# Patient Record
Sex: Female | Born: 1950 | Hispanic: No | Marital: Married | Smoking: Former smoker
Health system: Southern US, Community
[De-identification: ages and names within clinical notes are randomized; demographics above are authoritative.]

## PROBLEM LIST (undated history)

## (undated) DIAGNOSIS — K589 Irritable bowel syndrome without diarrhea: Secondary | ICD-10-CM

## (undated) DIAGNOSIS — R590 Localized enlarged lymph nodes: Secondary | ICD-10-CM

## (undated) DIAGNOSIS — K219 Gastro-esophageal reflux disease without esophagitis: Secondary | ICD-10-CM

## (undated) DIAGNOSIS — C4490 Unspecified malignant neoplasm of skin, unspecified: Secondary | ICD-10-CM

## (undated) DIAGNOSIS — E785 Hyperlipidemia, unspecified: Secondary | ICD-10-CM

## (undated) HISTORY — DX: Gastro-esophageal reflux disease without esophagitis: K21.9

## (undated) HISTORY — DX: Irritable bowel syndrome without diarrhea: K58.9

## (undated) HISTORY — DX: Localized enlarged lymph nodes: R59.0

## (undated) HISTORY — DX: Hyperlipidemia, unspecified: E78.5

## (undated) HISTORY — DX: Unspecified malignant neoplasm of skin, unspecified: C44.90

---

## 1999-12-30 ENCOUNTER — Other Ambulatory Visit: Admission: RE | Admit: 1999-12-30 | Discharge: 1999-12-30 | Payer: Self-pay | Admitting: Obstetrics and Gynecology

## 2001-01-18 ENCOUNTER — Other Ambulatory Visit: Admission: RE | Admit: 2001-01-18 | Discharge: 2001-01-18 | Payer: Self-pay | Admitting: Obstetrics and Gynecology

## 2003-11-01 ENCOUNTER — Other Ambulatory Visit: Admission: RE | Admit: 2003-11-01 | Discharge: 2003-11-01 | Payer: Self-pay | Admitting: Obstetrics and Gynecology

## 2004-04-01 ENCOUNTER — Ambulatory Visit: Payer: Self-pay | Admitting: Cardiology

## 2005-02-11 ENCOUNTER — Other Ambulatory Visit: Admission: RE | Admit: 2005-02-11 | Discharge: 2005-02-11 | Payer: Self-pay | Admitting: Obstetrics and Gynecology

## 2013-01-17 ENCOUNTER — Encounter: Payer: Self-pay | Admitting: Internal Medicine

## 2013-01-17 ENCOUNTER — Ambulatory Visit (INDEPENDENT_AMBULATORY_CARE_PROVIDER_SITE_OTHER): Payer: BC Managed Care – PPO | Admitting: Internal Medicine

## 2013-01-17 VITALS — BP 132/70 | HR 75 | Temp 97.2°F | Ht 67.0 in | Wt 180.8 lb

## 2013-01-17 DIAGNOSIS — R911 Solitary pulmonary nodule: Secondary | ICD-10-CM

## 2013-01-17 NOTE — Patient Instructions (Addendum)
The nodule is probably benign but the only way to prove it is by following the radiology guidelines and repeat a limited CT of the nodule in 03/30/13   GERD (REFLUX)  is an extremely common cause of respiratory symptoms, many times with no significant heartburn at all.    It can be treated with medication, but also with lifestyle changes including avoidance of late meals, excessive alcohol, smoking cessation, and avoid fatty foods, chocolate, peppermint, colas, red wine, and acidic juices such as orange juice.  NO MINT OR MENTHOL PRODUCTS SO NO COUGH DROPS  USE SUGARLESS CANDY INSTEAD (jolley ranchers or Stover's)  NO OIL BASED VITAMINS - use powdered substitutes.

## 2013-01-17 NOTE — Progress Notes (Signed)
  Subjective:    Patient ID: Jody Wise, female    DOB: 1951-03-23   MRN: [id number]  HPI  62 y.o. wf quit smoking 05/26/73 with incidental spn during w/u for neck swelling ? Related to ear or sinus infection referred 01/17/2013  by Dr Shary Decamp for eval of nodule   01/17/2013 1st [facility] office visit/ Bren Borys cc neck pain June 2014 aburpt onset much better p started on prilosec July 18th with better sense pnds, no hb with no cough or sob  No obvious daytime variabilty or assoc chronic cough or cp or chest tightness, subjective wheeze overt sinus or hb symptoms. No unusual exp hx or h/o childhood pna/ asthma or knowledge of premature birth.   Sleeping ok without nocturnal  or early am exacerbation  of respiratory  c/o's or need for noct saba. Also denies any obvious fluctuation of symptoms with weather or environmental changes or other aggravating or alleviating factors except as outlined above   Current Medications, Allergies, Past Medical History, Past Surgical History, Family History, and Social History were reviewed in [employer] record.      Review of Systems  Constitutional: Negative for fever, chills and unexpected weight change.  HENT: Positive for ear pain and dental problem. Negative for nosebleeds, congestion, sore throat, rhinorrhea, sneezing, trouble swallowing, voice change, postnasal drip and sinus pressure.   Eyes: Negative for visual disturbance.  Respiratory: Negative for cough, choking and shortness of breath.   Cardiovascular: Negative for chest pain and leg swelling.  Gastrointestinal: Negative for vomiting, abdominal pain and diarrhea.  Genitourinary: Negative for difficulty urinating.       Heartburn Indigestion  Musculoskeletal: Negative for arthralgias.  Skin: Negative for rash.  Neurological: Negative for tremors, syncope and headaches.  Hematological: Does not bruise/bleed easily.       Objective:   Physical Exam  Wt Readings  from Last 3 Encounters:  01/17/13 180 lb 12.8 oz (82.01 kg)    HEENT: nl dentition, turbinates, and orophanx. Nl external ear canals without cough reflex   NECK :  without JVD/Nodes/TM/ nl carotid upstrokes bilaterally   LUNGS: no acc muscle use, clear to A and P bilaterally without cough on insp or exp maneuvers   CV:  RRR  no s3 or murmur or increase in P2, no edema   ABD:  soft and nontender with nl excursion in the supine position. No bruits or organomegaly, bowel sounds nl  MS:  warm without deformities, calf tenderness, cyanosis or clubbing  SKIN: warm and dry without lesions    NEURO:  alert, approp, no deficits    CT chest 12/28/12  7 mm gg nodule RUL      Assessment & Plan:

## 2013-01-18 DIAGNOSIS — R911 Solitary pulmonary nodule: Secondary | ICD-10-CM | POA: Insufficient documentation

## 2013-01-18 NOTE — Assessment & Plan Note (Signed)
-   incidental RUL 7 mm 12/28/12   Although only a remote smoker, there is still a chance this could be an early bronchogenic carcinoma so reasonable to follow the [employer] guidelines with f/u ct in 3 month - no other options at this point   Discussed in detail all the  indications, usual  risks and alternatives (surgery which is not indicated, biopsy which is not possible, pet which is too insensitive @ 7 mm)   relative to the benefits with patient who agrees to proceed with f/u ct in 3 months

## 2013-04-02 ENCOUNTER — Encounter: Payer: Self-pay | Admitting: Internal Medicine

## 2013-04-11 ENCOUNTER — Encounter: Payer: Self-pay | Admitting: Internal Medicine

## 2013-04-11 ENCOUNTER — Telehealth: Payer: Self-pay | Admitting: Internal Medicine

## 2013-04-11 NOTE — Telephone Encounter (Signed)
No change in nodule so rec recheck in one year  ( I placed in tickle for recall then)

## 2013-04-11 NOTE — Telephone Encounter (Signed)
I spoke with patient about results and she verbalized understanding and had no questions 

## 2013-04-11 NOTE — Telephone Encounter (Signed)
lmomtcb x1 for pt 

## 2013-04-11 NOTE — Telephone Encounter (Signed)
Pt returning call can be reached at [phone].Jody Wise

## 2013-04-11 NOTE — Telephone Encounter (Signed)
I spoke with pt. She reports she had CT done at [facility] 03/31/13. She asking if he can look at this and give her feedback. Please advise MW thanks  --this can be pulled up in PACS

## 2013-04-22 ENCOUNTER — Encounter: Payer: Self-pay | Admitting: Internal Medicine

## 2013-08-30 ENCOUNTER — Telehealth: Payer: Self-pay | Admitting: *Deleted

## 2013-08-30 DIAGNOSIS — R911 Solitary pulmonary nodule: Secondary | ICD-10-CM

## 2013-08-30 NOTE — Telephone Encounter (Signed)
Order sent to [facility] for ct chest at [facility]

## 2013-08-30 NOTE — Telephone Encounter (Signed)
Message copied by Rosana Berger on Tue Aug 30, 2013  5:03 PM ------      Message from: Tanda Rockers      Created: Sat Apr 02, 2013  7:00 AM       Needs limited ct R apex at [facility] no contrast this month ------

## 2014-04-10 ENCOUNTER — Other Ambulatory Visit: Payer: Self-pay | Admitting: Internal Medicine

## 2014-04-10 DIAGNOSIS — R911 Solitary pulmonary nodule: Secondary | ICD-10-CM

## 2015-09-25 DIAGNOSIS — K219 Gastro-esophageal reflux disease without esophagitis: Secondary | ICD-10-CM | POA: Insufficient documentation

## 2015-09-25 DIAGNOSIS — E559 Vitamin D deficiency, unspecified: Secondary | ICD-10-CM | POA: Insufficient documentation

## 2015-09-25 DIAGNOSIS — E782 Mixed hyperlipidemia: Secondary | ICD-10-CM | POA: Insufficient documentation

## 2015-09-25 DIAGNOSIS — E041 Nontoxic single thyroid nodule: Secondary | ICD-10-CM | POA: Insufficient documentation

## 2015-09-25 DIAGNOSIS — K582 Mixed irritable bowel syndrome: Secondary | ICD-10-CM | POA: Insufficient documentation

## 2015-09-25 DIAGNOSIS — Z79899 Other long term (current) drug therapy: Secondary | ICD-10-CM | POA: Insufficient documentation

## 2015-09-26 DIAGNOSIS — R5381 Other malaise: Secondary | ICD-10-CM | POA: Insufficient documentation

## 2015-09-26 DIAGNOSIS — R5383 Other fatigue: Secondary | ICD-10-CM | POA: Insufficient documentation

## 2016-07-17 DIAGNOSIS — M8588 Other specified disorders of bone density and structure, other site: Secondary | ICD-10-CM | POA: Diagnosis not present

## 2016-07-17 DIAGNOSIS — Z13820 Encounter for screening for osteoporosis: Secondary | ICD-10-CM | POA: Diagnosis not present

## 2016-07-17 DIAGNOSIS — Z01419 Encounter for gynecological examination (general) (routine) without abnormal findings: Secondary | ICD-10-CM | POA: Diagnosis not present

## 2016-07-17 DIAGNOSIS — Z6827 Body mass index (BMI) 27.0-27.9, adult: Secondary | ICD-10-CM | POA: Diagnosis not present

## 2016-07-17 DIAGNOSIS — N958 Other specified menopausal and perimenopausal disorders: Secondary | ICD-10-CM | POA: Diagnosis not present

## 2016-07-17 DIAGNOSIS — Z1272 Encounter for screening for malignant neoplasm of vagina: Secondary | ICD-10-CM | POA: Diagnosis not present

## 2016-07-17 DIAGNOSIS — K589 Irritable bowel syndrome without diarrhea: Secondary | ICD-10-CM | POA: Diagnosis not present

## 2016-07-17 DIAGNOSIS — Z90712 Acquired absence of cervix with remaining uterus: Secondary | ICD-10-CM | POA: Diagnosis not present

## 2016-07-17 DIAGNOSIS — Z136 Encounter for screening for cardiovascular disorders: Secondary | ICD-10-CM | POA: Diagnosis not present

## 2016-07-17 DIAGNOSIS — R5383 Other fatigue: Secondary | ICD-10-CM | POA: Diagnosis not present

## 2016-07-17 DIAGNOSIS — K588 Other irritable bowel syndrome: Secondary | ICD-10-CM | POA: Diagnosis not present

## 2016-07-17 DIAGNOSIS — Z124 Encounter for screening for malignant neoplasm of cervix: Secondary | ICD-10-CM | POA: Diagnosis not present

## 2016-07-28 DIAGNOSIS — R197 Diarrhea, unspecified: Secondary | ICD-10-CM | POA: Diagnosis not present

## 2016-07-28 DIAGNOSIS — R140 Abdominal distension (gaseous): Secondary | ICD-10-CM | POA: Diagnosis not present

## 2016-07-28 DIAGNOSIS — E785 Hyperlipidemia, unspecified: Secondary | ICD-10-CM | POA: Diagnosis not present

## 2016-09-23 DIAGNOSIS — Z79899 Other long term (current) drug therapy: Secondary | ICD-10-CM | POA: Diagnosis not present

## 2016-09-23 DIAGNOSIS — E782 Mixed hyperlipidemia: Secondary | ICD-10-CM | POA: Diagnosis not present

## 2016-09-23 DIAGNOSIS — E559 Vitamin D deficiency, unspecified: Secondary | ICD-10-CM | POA: Diagnosis not present

## 2016-09-23 DIAGNOSIS — R5383 Other fatigue: Secondary | ICD-10-CM | POA: Diagnosis not present

## 2016-09-23 DIAGNOSIS — K582 Mixed irritable bowel syndrome: Secondary | ICD-10-CM | POA: Diagnosis not present

## 2016-09-23 DIAGNOSIS — K219 Gastro-esophageal reflux disease without esophagitis: Secondary | ICD-10-CM | POA: Diagnosis not present

## 2016-09-23 DIAGNOSIS — R5381 Other malaise: Secondary | ICD-10-CM | POA: Diagnosis not present

## 2016-09-23 DIAGNOSIS — E041 Nontoxic single thyroid nodule: Secondary | ICD-10-CM | POA: Diagnosis not present

## 2017-01-06 ENCOUNTER — Telehealth: Payer: Self-pay | Admitting: Internal Medicine

## 2017-01-06 NOTE — Telephone Encounter (Signed)
Called and spoke to pt. Pt is requesting a f/u CT for lung nodule. Pt last seen in 2014. Appt made with MW in 01/2017. Pt verbalized understanding and denied any further questions or concerns at this time.

## 2017-01-21 DIAGNOSIS — H906 Mixed conductive and sensorineural hearing loss, bilateral: Secondary | ICD-10-CM | POA: Diagnosis not present

## 2017-01-21 DIAGNOSIS — H9202 Otalgia, left ear: Secondary | ICD-10-CM | POA: Diagnosis not present

## 2017-01-21 DIAGNOSIS — H6982 Other specified disorders of Eustachian tube, left ear: Secondary | ICD-10-CM | POA: Diagnosis not present

## 2017-01-21 DIAGNOSIS — M26622 Arthralgia of left temporomandibular joint: Secondary | ICD-10-CM | POA: Diagnosis not present

## 2017-01-21 DIAGNOSIS — H9312 Tinnitus, left ear: Secondary | ICD-10-CM | POA: Diagnosis not present

## 2017-01-21 DIAGNOSIS — H7412 Adhesive left middle ear disease: Secondary | ICD-10-CM | POA: Diagnosis not present

## 2017-01-28 DIAGNOSIS — H906 Mixed conductive and sensorineural hearing loss, bilateral: Secondary | ICD-10-CM | POA: Diagnosis not present

## 2017-01-28 DIAGNOSIS — H6982 Other specified disorders of Eustachian tube, left ear: Secondary | ICD-10-CM | POA: Diagnosis not present

## 2017-01-28 DIAGNOSIS — H7412 Adhesive left middle ear disease: Secondary | ICD-10-CM | POA: Diagnosis not present

## 2017-01-28 DIAGNOSIS — M26622 Arthralgia of left temporomandibular joint: Secondary | ICD-10-CM | POA: Diagnosis not present

## 2017-01-28 DIAGNOSIS — H9202 Otalgia, left ear: Secondary | ICD-10-CM | POA: Diagnosis not present

## 2017-01-28 DIAGNOSIS — H9312 Tinnitus, left ear: Secondary | ICD-10-CM | POA: Diagnosis not present

## 2017-02-13 ENCOUNTER — Ambulatory Visit (INDEPENDENT_AMBULATORY_CARE_PROVIDER_SITE_OTHER): Payer: PPO | Admitting: Internal Medicine

## 2017-02-13 ENCOUNTER — Encounter: Payer: Self-pay | Admitting: Internal Medicine

## 2017-02-13 VITALS — BP 106/60 | HR 65 | Ht 67.0 in | Wt 176.6 lb

## 2017-02-13 DIAGNOSIS — R911 Solitary pulmonary nodule: Secondary | ICD-10-CM | POA: Diagnosis not present

## 2017-02-13 NOTE — Patient Instructions (Signed)
Please see patient coordinator before you leave today  to schedule CT chest no contrast needed   Pulmonary follow up is as needed

## 2017-02-13 NOTE — Progress Notes (Signed)
Subjective:    Patient ID: Jody Wise, female    DOB: 1950-12-21   MRN: [id number]    Brief patient profile:  66 y.o. wf quit smoking 05/26/73 with incidental spn during w/u for neck swelling ? Related to ear or sinus infection referred 01/17/2013  by Dr Bea Graff for eval of nodule.    History of Present Illness  01/17/2013 1st [place] [facility] office visit/ Jaleya Pebley cc neck pain June 2014 abrupt onset much better p started on prilosec July 18th with better sense pnds, no hb with no cough or sob rec The nodule is probably benign but the only way to prove it is by following the radiology guidelines and repeat a limited CT of the nodule in 03/30/13> no change > rec repeat in one year but declined due to cost  GERD diet    02/13/2017  f/u ov/Kolbie Lepkowski re:  Re-establish re SPN  > 3 years since last contact  Chief Complaint  Patient presents with  . Pulmonary Consult    Self referral to f/u on pulmonary nodule. She denies any respiratory co's today.    Almost all her upper resp symptoms resolved p oral surgery at [facility] - does have TMJ, uses aleve, no other rx other than prilosec prn and dicyclomine for ibs   Not limited by breathing from desired activities/ occ HB but only takes ppi prn     No obvious day to day or daytime variability or assoc excess/ purulent sputum or mucus plugs or hemoptysis or cp or chest tightness, subjective wheeze or overt sinus  symptoms. No unusual exp hx or h/o childhood pna/ asthma or knowledge of premature birth.  Sleeping ok flat without nocturnal  or early am exacerbation  of respiratory  c/o's or need for noct saba. Also denies any obvious fluctuation of symptoms with weather or environmental changes or other aggravating or alleviating factors except as outlined above   Current Allergies, Complete Past Medical History, Past Surgical History, Family History, and Social History were reviewed in [employer] record.  ROS  The following are not  active complaints unless bolded sore throat, dysphagia, dental problems, itching, sneezing,  nasal congestion or disharge of excess mucus or purulent secretions, ear ache,   fever, chills, sweats, unintended wt loss or wt gain, classically pleuritic or exertional cp,  orthopnea pnd or leg swelling, presyncope, palpitations, abdominal pain controlled on ibs rx, anorexia, nausea, vomiting, diarrhea  or change in bowel habits or bladder habits, change in stools or change in urine, dysuria, hematuria,  rash, arthralgias, visual complaints, headache, numbness, weakness or ataxia or problems with walking or coordination,  change in mood/affect or memory.        Current Meds  Medication Sig  . dicyclomine (BENTYL) 10 MG capsule Take 10 mg by mouth 2 (two) times daily.  Marland Kitchen omeprazole (PRILOSEC) 20 MG capsule Take 20 mg by mouth 2 (two) times daily as needed.             Objective:   Physical Exam  Wt Readings from Last 3 Encounters:  02/13/17 176 lb 9.6 oz (80.1 kg)  01/17/13 180 lb 12.8 oz (82 kg)    Vital signs reviewed  - Note on arrival 02 sats  99% on RA      HEENT: nl dentition, turbinates, and orophanx. Nl external ear canals without cough reflex   NECK :  without JVD/Nodes/TM/ nl carotid upstrokes bilaterally   LUNGS: no acc muscle use, clear to A  and P bilaterally without cough on insp or exp maneuvers   CV:  RRR  no s3 or murmur or increase in P2, no edema   ABD:  soft and nontender with nl excursion in the supine position. No bruits or organomegaly, bowel sounds nl  MS:  warm without deformities, calf tenderness, cyanosis or clubbing  SKIN: warm and dry without lesions    NEURO:  alert, approp, no deficits         Assessment & Plan:

## 2017-02-13 NOTE — Assessment & Plan Note (Signed)
-   incidental RUL 7 mm 12/28/12 > repeat 03/29/13 no change, rec repeat 6 months limited (former smoker so higher risk) >  9 x7 mm GG changes s growth > rec recheck 12 months > declined due to insurance - Repeat CT chest 02/13/2017  CT results reviewed with pt >>> Prev nodule Too small for PET or bx, not suspicious enough for excisional bx > really only option for now is follow the [employer]ischner society guidelines as rec by radiology > overdue for f/u > will have it done here so prev studies can be loaded onto canopy for use moving forward if needed  but hopefully this will  Be the final study.  Discussed in detail all the  indications, usual  risks and alternatives  relative to the benefits with patient who agrees to proceed with conservative f/u as outlined    I had an extended discussion with the patient reviewing all relevant studies completed to date and  lasting 25 minutes of a 45  minute office visit to re-establish serial f/u of non-specific nodule

## 2017-03-02 ENCOUNTER — Ambulatory Visit (INDEPENDENT_AMBULATORY_CARE_PROVIDER_SITE_OTHER)
Admission: RE | Admit: 2017-03-02 | Discharge: 2017-03-02 | Disposition: A | Discharge status: Home / Self Care | Payer: PPO | Source: Ambulatory Visit | Attending: Internal Medicine | Admitting: Internal Medicine

## 2017-03-02 DIAGNOSIS — R911 Solitary pulmonary nodule: Secondary | ICD-10-CM | POA: Diagnosis not present

## 2017-03-03 NOTE — Progress Notes (Signed)
Spoke with pt and notified of results per Dr. Wert. Pt verbalized understanding and denied any questions. 

## 2017-09-02 DIAGNOSIS — E782 Mixed hyperlipidemia: Secondary | ICD-10-CM | POA: Diagnosis not present

## 2017-09-02 DIAGNOSIS — E041 Nontoxic single thyroid nodule: Secondary | ICD-10-CM | POA: Diagnosis not present

## 2017-09-02 DIAGNOSIS — K219 Gastro-esophageal reflux disease without esophagitis: Secondary | ICD-10-CM | POA: Diagnosis not present

## 2017-09-02 DIAGNOSIS — E559 Vitamin D deficiency, unspecified: Secondary | ICD-10-CM | POA: Diagnosis not present

## 2017-09-02 DIAGNOSIS — R5381 Other malaise: Secondary | ICD-10-CM | POA: Diagnosis not present

## 2017-09-02 DIAGNOSIS — Z0000 Encounter for general adult medical examination without abnormal findings: Secondary | ICD-10-CM | POA: Diagnosis not present

## 2017-09-02 DIAGNOSIS — R1012 Left upper quadrant pain: Secondary | ICD-10-CM | POA: Diagnosis not present

## 2017-09-02 DIAGNOSIS — R5383 Other fatigue: Secondary | ICD-10-CM | POA: Diagnosis not present

## 2017-09-02 DIAGNOSIS — R911 Solitary pulmonary nodule: Secondary | ICD-10-CM | POA: Diagnosis not present

## 2017-09-02 DIAGNOSIS — Z79899 Other long term (current) drug therapy: Secondary | ICD-10-CM | POA: Diagnosis not present

## 2017-09-02 DIAGNOSIS — K582 Mixed irritable bowel syndrome: Secondary | ICD-10-CM | POA: Diagnosis not present

## 2018-01-06 DIAGNOSIS — E7800 Pure hypercholesterolemia, unspecified: Secondary | ICD-10-CM | POA: Diagnosis not present

## 2018-01-06 DIAGNOSIS — Z6827 Body mass index (BMI) 27.0-27.9, adult: Secondary | ICD-10-CM | POA: Diagnosis not present

## 2018-01-06 DIAGNOSIS — Z01419 Encounter for gynecological examination (general) (routine) without abnormal findings: Secondary | ICD-10-CM | POA: Diagnosis not present

## 2018-01-26 ENCOUNTER — Other Ambulatory Visit: Payer: Self-pay | Admitting: Internal Medicine

## 2018-01-26 DIAGNOSIS — R911 Solitary pulmonary nodule: Secondary | ICD-10-CM

## 2018-01-26 DIAGNOSIS — R918 Other nonspecific abnormal finding of lung field: Secondary | ICD-10-CM

## 2018-03-03 ENCOUNTER — Ambulatory Visit (INDEPENDENT_AMBULATORY_CARE_PROVIDER_SITE_OTHER)
Admission: RE | Admit: 2018-03-03 | Discharge: 2018-03-03 | Disposition: A | Discharge status: Home / Self Care | Payer: PPO | Source: Ambulatory Visit | Attending: Internal Medicine | Admitting: Internal Medicine

## 2018-03-03 DIAGNOSIS — R911 Solitary pulmonary nodule: Secondary | ICD-10-CM

## 2018-03-03 DIAGNOSIS — R918 Other nonspecific abnormal finding of lung field: Secondary | ICD-10-CM

## 2018-03-03 NOTE — Progress Notes (Signed)
Spoke with pt and notified of results per Dr. Wert. Pt verbalized understanding and denied any questions. 

## 2018-06-04 DIAGNOSIS — L821 Other seborrheic keratosis: Secondary | ICD-10-CM | POA: Diagnosis not present

## 2018-06-04 DIAGNOSIS — D485 Neoplasm of uncertain behavior of skin: Secondary | ICD-10-CM | POA: Diagnosis not present

## 2018-06-04 DIAGNOSIS — D224 Melanocytic nevi of scalp and neck: Secondary | ICD-10-CM | POA: Diagnosis not present

## 2018-07-14 DIAGNOSIS — K219 Gastro-esophageal reflux disease without esophagitis: Secondary | ICD-10-CM | POA: Diagnosis not present

## 2018-07-14 DIAGNOSIS — R7303 Prediabetes: Secondary | ICD-10-CM | POA: Insufficient documentation

## 2018-07-14 DIAGNOSIS — F419 Anxiety disorder, unspecified: Secondary | ICD-10-CM | POA: Insufficient documentation

## 2018-07-14 DIAGNOSIS — M8949 Other hypertrophic osteoarthropathy, multiple sites: Secondary | ICD-10-CM | POA: Insufficient documentation

## 2018-07-14 DIAGNOSIS — M159 Polyosteoarthritis, unspecified: Secondary | ICD-10-CM | POA: Insufficient documentation

## 2018-07-14 DIAGNOSIS — M150 Primary generalized (osteo)arthritis: Secondary | ICD-10-CM | POA: Diagnosis not present

## 2018-07-14 DIAGNOSIS — R911 Solitary pulmonary nodule: Secondary | ICD-10-CM | POA: Diagnosis not present

## 2018-07-14 DIAGNOSIS — E782 Mixed hyperlipidemia: Secondary | ICD-10-CM | POA: Diagnosis not present

## 2018-07-14 DIAGNOSIS — E041 Nontoxic single thyroid nodule: Secondary | ICD-10-CM | POA: Diagnosis not present

## 2018-07-14 DIAGNOSIS — Z79899 Other long term (current) drug therapy: Secondary | ICD-10-CM | POA: Diagnosis not present

## 2018-07-14 DIAGNOSIS — E559 Vitamin D deficiency, unspecified: Secondary | ICD-10-CM | POA: Diagnosis not present

## 2018-07-14 DIAGNOSIS — K582 Mixed irritable bowel syndrome: Secondary | ICD-10-CM | POA: Diagnosis not present

## 2018-07-14 DIAGNOSIS — R5381 Other malaise: Secondary | ICD-10-CM | POA: Diagnosis not present

## 2018-07-14 DIAGNOSIS — R5383 Other fatigue: Secondary | ICD-10-CM | POA: Diagnosis not present

## 2018-07-27 DIAGNOSIS — M47816 Spondylosis without myelopathy or radiculopathy, lumbar region: Secondary | ICD-10-CM | POA: Diagnosis not present

## 2018-07-27 DIAGNOSIS — M47814 Spondylosis without myelopathy or radiculopathy, thoracic region: Secondary | ICD-10-CM | POA: Diagnosis not present

## 2018-07-27 DIAGNOSIS — M4184 Other forms of scoliosis, thoracic region: Secondary | ICD-10-CM | POA: Diagnosis not present

## 2018-07-27 DIAGNOSIS — M160 Bilateral primary osteoarthritis of hip: Secondary | ICD-10-CM | POA: Diagnosis not present

## 2018-07-28 DIAGNOSIS — M150 Primary generalized (osteo)arthritis: Secondary | ICD-10-CM | POA: Diagnosis not present

## 2018-10-13 DIAGNOSIS — H52223 Regular astigmatism, bilateral: Secondary | ICD-10-CM | POA: Diagnosis not present

## 2018-11-17 DIAGNOSIS — D225 Melanocytic nevi of trunk: Secondary | ICD-10-CM | POA: Diagnosis not present

## 2018-11-17 DIAGNOSIS — L918 Other hypertrophic disorders of the skin: Secondary | ICD-10-CM | POA: Diagnosis not present

## 2018-11-17 DIAGNOSIS — L821 Other seborrheic keratosis: Secondary | ICD-10-CM | POA: Diagnosis not present

## 2018-11-17 DIAGNOSIS — Z85828 Personal history of other malignant neoplasm of skin: Secondary | ICD-10-CM | POA: Diagnosis not present

## 2018-11-17 DIAGNOSIS — D2261 Melanocytic nevi of right upper limb, including shoulder: Secondary | ICD-10-CM | POA: Diagnosis not present

## 2018-11-17 DIAGNOSIS — D2272 Melanocytic nevi of left lower limb, including hip: Secondary | ICD-10-CM | POA: Diagnosis not present

## 2018-11-17 DIAGNOSIS — D1801 Hemangioma of skin and subcutaneous tissue: Secondary | ICD-10-CM | POA: Diagnosis not present

## 2018-11-17 DIAGNOSIS — D2262 Melanocytic nevi of left upper limb, including shoulder: Secondary | ICD-10-CM | POA: Diagnosis not present

## 2018-11-17 DIAGNOSIS — D2271 Melanocytic nevi of right lower limb, including hip: Secondary | ICD-10-CM | POA: Diagnosis not present

## 2019-01-25 DIAGNOSIS — R5381 Other malaise: Secondary | ICD-10-CM | POA: Diagnosis not present

## 2019-01-25 DIAGNOSIS — E041 Nontoxic single thyroid nodule: Secondary | ICD-10-CM | POA: Diagnosis not present

## 2019-01-25 DIAGNOSIS — E559 Vitamin D deficiency, unspecified: Secondary | ICD-10-CM | POA: Diagnosis not present

## 2019-01-25 DIAGNOSIS — Z79899 Other long term (current) drug therapy: Secondary | ICD-10-CM | POA: Diagnosis not present

## 2019-01-25 DIAGNOSIS — M8949 Other hypertrophic osteoarthropathy, multiple sites: Secondary | ICD-10-CM | POA: Diagnosis not present

## 2019-01-25 DIAGNOSIS — E782 Mixed hyperlipidemia: Secondary | ICD-10-CM | POA: Diagnosis not present

## 2019-01-25 DIAGNOSIS — F5101 Primary insomnia: Secondary | ICD-10-CM | POA: Insufficient documentation

## 2019-01-25 DIAGNOSIS — G629 Polyneuropathy, unspecified: Secondary | ICD-10-CM | POA: Diagnosis not present

## 2019-01-25 DIAGNOSIS — M79604 Pain in right leg: Secondary | ICD-10-CM | POA: Diagnosis not present

## 2019-01-25 DIAGNOSIS — K582 Mixed irritable bowel syndrome: Secondary | ICD-10-CM | POA: Diagnosis not present

## 2019-01-25 DIAGNOSIS — R7303 Prediabetes: Secondary | ICD-10-CM | POA: Diagnosis not present

## 2019-01-25 DIAGNOSIS — K219 Gastro-esophageal reflux disease without esophagitis: Secondary | ICD-10-CM | POA: Diagnosis not present

## 2019-01-25 DIAGNOSIS — F419 Anxiety disorder, unspecified: Secondary | ICD-10-CM | POA: Diagnosis not present

## 2019-01-26 DIAGNOSIS — M50322 Other cervical disc degeneration at C5-C6 level: Secondary | ICD-10-CM | POA: Diagnosis not present

## 2019-01-26 DIAGNOSIS — M50321 Other cervical disc degeneration at C4-C5 level: Secondary | ICD-10-CM | POA: Diagnosis not present

## 2019-01-26 DIAGNOSIS — M542 Cervicalgia: Secondary | ICD-10-CM | POA: Diagnosis not present

## 2019-01-26 DIAGNOSIS — R5381 Other malaise: Secondary | ICD-10-CM | POA: Diagnosis not present

## 2019-01-26 DIAGNOSIS — E559 Vitamin D deficiency, unspecified: Secondary | ICD-10-CM | POA: Diagnosis not present

## 2019-01-26 DIAGNOSIS — G629 Polyneuropathy, unspecified: Secondary | ICD-10-CM | POA: Diagnosis not present

## 2019-01-26 DIAGNOSIS — R5383 Other fatigue: Secondary | ICD-10-CM | POA: Diagnosis not present

## 2019-01-26 DIAGNOSIS — R7989 Other specified abnormal findings of blood chemistry: Secondary | ICD-10-CM | POA: Diagnosis not present

## 2019-01-26 DIAGNOSIS — E782 Mixed hyperlipidemia: Secondary | ICD-10-CM | POA: Diagnosis not present

## 2019-01-26 DIAGNOSIS — M47812 Spondylosis without myelopathy or radiculopathy, cervical region: Secondary | ICD-10-CM | POA: Diagnosis not present

## 2019-02-16 DIAGNOSIS — H903 Sensorineural hearing loss, bilateral: Secondary | ICD-10-CM | POA: Diagnosis not present

## 2019-03-07 DIAGNOSIS — M8949 Other hypertrophic osteoarthropathy, multiple sites: Secondary | ICD-10-CM | POA: Diagnosis not present

## 2019-03-07 DIAGNOSIS — K582 Mixed irritable bowel syndrome: Secondary | ICD-10-CM | POA: Diagnosis not present

## 2019-03-07 DIAGNOSIS — Z8719 Personal history of other diseases of the digestive system: Secondary | ICD-10-CM | POA: Diagnosis not present

## 2019-03-08 DIAGNOSIS — K51911 Ulcerative colitis, unspecified with rectal bleeding: Secondary | ICD-10-CM | POA: Diagnosis not present

## 2019-03-14 DIAGNOSIS — K51911 Ulcerative colitis, unspecified with rectal bleeding: Secondary | ICD-10-CM | POA: Diagnosis not present

## 2019-03-14 DIAGNOSIS — K529 Noninfective gastroenteritis and colitis, unspecified: Secondary | ICD-10-CM | POA: Diagnosis not present

## 2019-03-14 DIAGNOSIS — K6389 Other specified diseases of intestine: Secondary | ICD-10-CM | POA: Diagnosis not present

## 2019-04-13 DIAGNOSIS — H90A32 Mixed conductive and sensorineural hearing loss, unilateral, left ear with restricted hearing on the contralateral side: Secondary | ICD-10-CM | POA: Insufficient documentation

## 2019-04-13 DIAGNOSIS — H90A21 Sensorineural hearing loss, unilateral, right ear, with restricted hearing on the contralateral side: Secondary | ICD-10-CM | POA: Insufficient documentation

## 2019-04-14 ENCOUNTER — Telehealth: Payer: Self-pay | Admitting: Gastroenterology

## 2019-04-15 DIAGNOSIS — G4489 Other headache syndrome: Secondary | ICD-10-CM | POA: Diagnosis not present

## 2019-04-15 DIAGNOSIS — M950 Acquired deformity of nose: Secondary | ICD-10-CM | POA: Insufficient documentation

## 2019-04-15 DIAGNOSIS — Z974 Presence of external hearing-aid: Secondary | ICD-10-CM | POA: Diagnosis not present

## 2019-04-15 DIAGNOSIS — Z87891 Personal history of nicotine dependence: Secondary | ICD-10-CM | POA: Diagnosis not present

## 2019-04-15 DIAGNOSIS — H90A32 Mixed conductive and sensorineural hearing loss, unilateral, left ear with restricted hearing on the contralateral side: Secondary | ICD-10-CM | POA: Diagnosis not present

## 2019-04-15 DIAGNOSIS — H90A21 Sensorineural hearing loss, unilateral, right ear, with restricted hearing on the contralateral side: Secondary | ICD-10-CM | POA: Diagnosis not present

## 2019-04-29 NOTE — Telephone Encounter (Signed)
Please update status of records review.  °

## 2019-04-29 NOTE — Telephone Encounter (Signed)
Have not seen these records or personally placed them on Dr. Lynne Leader desk. Re-request them and hand them to me to place them on his desk.

## 2019-05-02 NOTE — Telephone Encounter (Signed)
Pt only dropped off last colon report with no additional records.  Spoke with patient and she is suppose to have all GI records sent for review.

## 2019-05-02 NOTE — Telephone Encounter (Signed)
Additional office notes received from Dr. Rolm Bookbinder and colon report will be sent to Dr. Fuller Plan for review.

## 2019-05-04 DIAGNOSIS — G4489 Other headache syndrome: Secondary | ICD-10-CM | POA: Diagnosis not present

## 2019-05-06 NOTE — Telephone Encounter (Signed)
Dr. Fuller Plan reviewed records and declined to accept patient at this time.  Pt was notified and records were shredded

## 2019-06-28 DIAGNOSIS — M461 Sacroiliitis, not elsewhere classified: Secondary | ICD-10-CM | POA: Diagnosis not present

## 2019-06-28 DIAGNOSIS — K5130 Ulcerative (chronic) rectosigmoiditis without complications: Secondary | ICD-10-CM | POA: Diagnosis not present

## 2019-07-28 DIAGNOSIS — K5130 Ulcerative (chronic) rectosigmoiditis without complications: Secondary | ICD-10-CM | POA: Diagnosis not present

## 2019-08-04 DIAGNOSIS — K5130 Ulcerative (chronic) rectosigmoiditis without complications: Secondary | ICD-10-CM | POA: Diagnosis not present

## 2019-09-27 DIAGNOSIS — K219 Gastro-esophageal reflux disease without esophagitis: Secondary | ICD-10-CM | POA: Diagnosis not present

## 2019-09-27 DIAGNOSIS — E782 Mixed hyperlipidemia: Secondary | ICD-10-CM | POA: Diagnosis not present

## 2019-09-27 DIAGNOSIS — K5190 Ulcerative colitis, unspecified, without complications: Secondary | ICD-10-CM | POA: Insufficient documentation

## 2019-09-27 DIAGNOSIS — Z79899 Other long term (current) drug therapy: Secondary | ICD-10-CM | POA: Diagnosis not present

## 2019-09-27 DIAGNOSIS — R5381 Other malaise: Secondary | ICD-10-CM | POA: Diagnosis not present

## 2019-09-27 DIAGNOSIS — H90A21 Sensorineural hearing loss, unilateral, right ear, with restricted hearing on the contralateral side: Secondary | ICD-10-CM | POA: Diagnosis not present

## 2019-09-27 DIAGNOSIS — R911 Solitary pulmonary nodule: Secondary | ICD-10-CM | POA: Diagnosis not present

## 2019-09-27 DIAGNOSIS — E041 Nontoxic single thyroid nodule: Secondary | ICD-10-CM | POA: Diagnosis not present

## 2019-09-27 DIAGNOSIS — H90A32 Mixed conductive and sensorineural hearing loss, unilateral, left ear with restricted hearing on the contralateral side: Secondary | ICD-10-CM | POA: Diagnosis not present

## 2019-09-27 DIAGNOSIS — M8949 Other hypertrophic osteoarthropathy, multiple sites: Secondary | ICD-10-CM | POA: Diagnosis not present

## 2019-09-27 DIAGNOSIS — K582 Mixed irritable bowel syndrome: Secondary | ICD-10-CM | POA: Diagnosis not present

## 2019-09-27 DIAGNOSIS — R7303 Prediabetes: Secondary | ICD-10-CM | POA: Diagnosis not present

## 2019-09-28 DIAGNOSIS — E782 Mixed hyperlipidemia: Secondary | ICD-10-CM | POA: Diagnosis not present

## 2019-09-28 DIAGNOSIS — R5383 Other fatigue: Secondary | ICD-10-CM | POA: Diagnosis not present

## 2019-09-28 DIAGNOSIS — K5190 Ulcerative colitis, unspecified, without complications: Secondary | ICD-10-CM | POA: Diagnosis not present

## 2019-09-28 DIAGNOSIS — R7303 Prediabetes: Secondary | ICD-10-CM | POA: Diagnosis not present

## 2019-09-28 DIAGNOSIS — R5381 Other malaise: Secondary | ICD-10-CM | POA: Diagnosis not present

## 2019-10-06 DIAGNOSIS — K5130 Ulcerative (chronic) rectosigmoiditis without complications: Secondary | ICD-10-CM | POA: Diagnosis not present

## 2019-10-13 DIAGNOSIS — K5130 Ulcerative (chronic) rectosigmoiditis without complications: Secondary | ICD-10-CM | POA: Diagnosis not present

## 2019-11-17 DIAGNOSIS — K5130 Ulcerative (chronic) rectosigmoiditis without complications: Secondary | ICD-10-CM | POA: Diagnosis not present

## 2020-01-06 DIAGNOSIS — B351 Tinea unguium: Secondary | ICD-10-CM | POA: Diagnosis not present

## 2020-01-06 DIAGNOSIS — D2261 Melanocytic nevi of right upper limb, including shoulder: Secondary | ICD-10-CM | POA: Diagnosis not present

## 2020-01-06 DIAGNOSIS — D1801 Hemangioma of skin and subcutaneous tissue: Secondary | ICD-10-CM | POA: Diagnosis not present

## 2020-01-06 DIAGNOSIS — D2262 Melanocytic nevi of left upper limb, including shoulder: Secondary | ICD-10-CM | POA: Diagnosis not present

## 2020-01-06 DIAGNOSIS — L304 Erythema intertrigo: Secondary | ICD-10-CM | POA: Diagnosis not present

## 2020-01-06 DIAGNOSIS — L820 Inflamed seborrheic keratosis: Secondary | ICD-10-CM | POA: Diagnosis not present

## 2020-01-06 DIAGNOSIS — L821 Other seborrheic keratosis: Secondary | ICD-10-CM | POA: Diagnosis not present

## 2020-01-06 DIAGNOSIS — D2272 Melanocytic nevi of left lower limb, including hip: Secondary | ICD-10-CM | POA: Diagnosis not present

## 2020-01-06 DIAGNOSIS — D225 Melanocytic nevi of trunk: Secondary | ICD-10-CM | POA: Diagnosis not present

## 2020-02-10 IMAGING — CT CT CHEST W/O CM
2 of 3 series · 15 of 36 positions shown, 18 images · non-contrast
Comparison: 03/02/2017

CLINICAL DATA: Pulmonary nodule.  1 year follow-up.

EXAM:
CT CHEST WITHOUT CONTRAST
TECHNIQUE: Multidetector CT imaging of the chest was performed following the
standard protocol without IV contrast.

[Series 2: thorax · axial · 0.73mm/px · z∈[-306,-48]mm · 12 of 153 slices shown, 15 images]
[im 12/153  mediastinal]
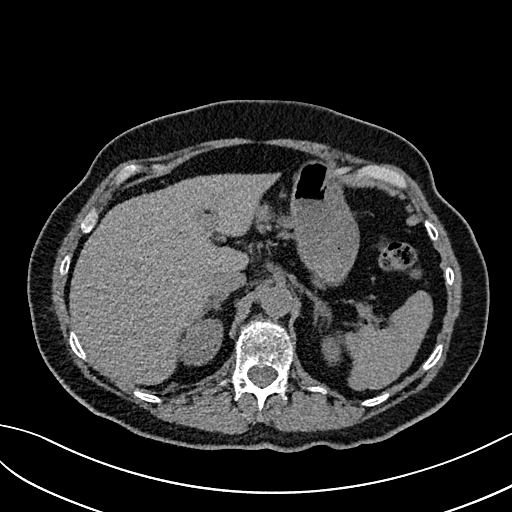
[im 12/153  lung]
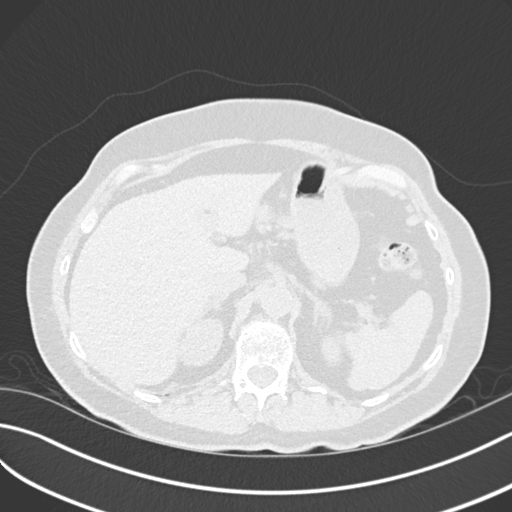
[im 23/153  lung]
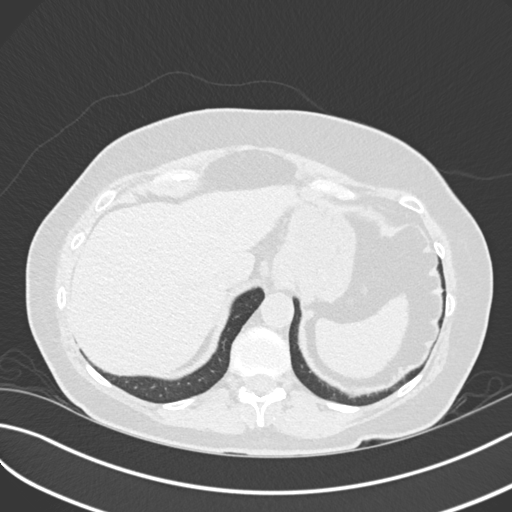
[im 34/153  lung]
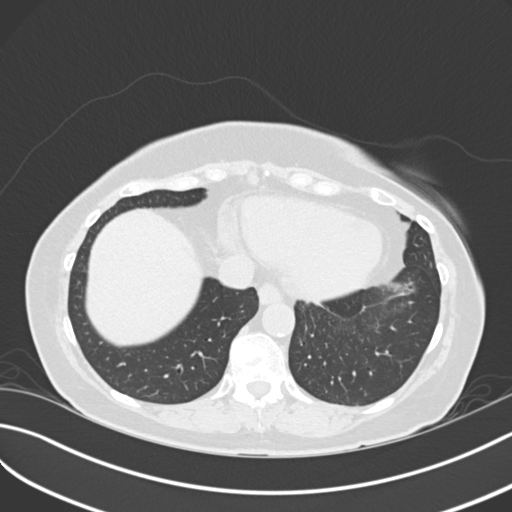
[im 46/153  lung]
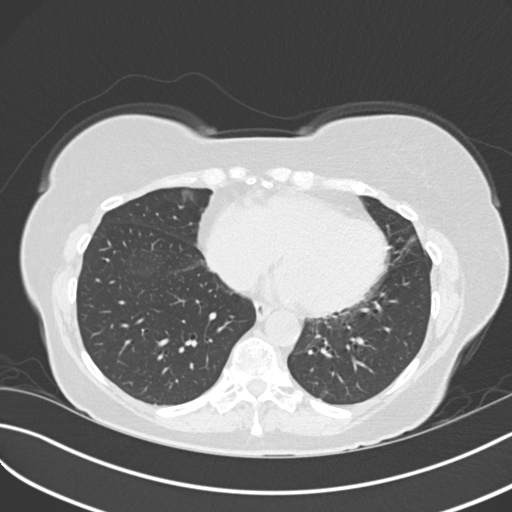
[im 57/153  mediastinal]
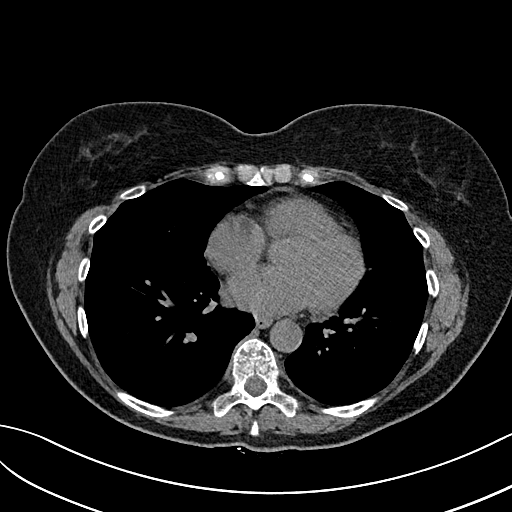
[im 57/153  lung]
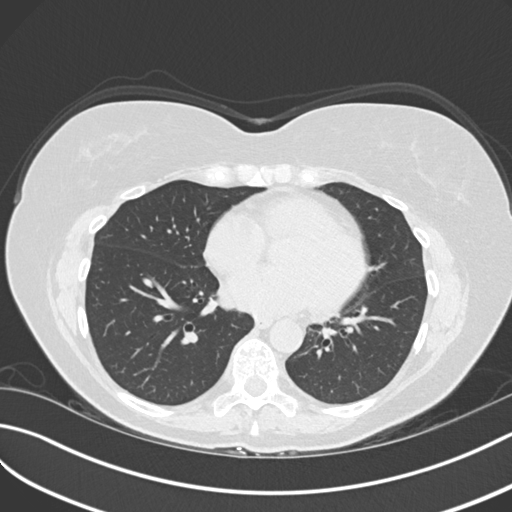
[im 68/153  lung]
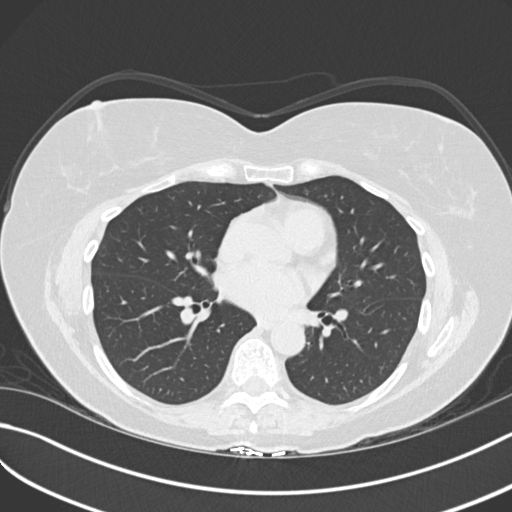
[im 85/153  lung]
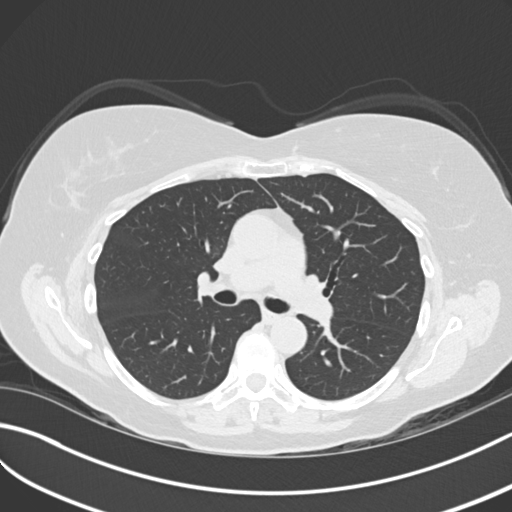
[im 96/153  lung]
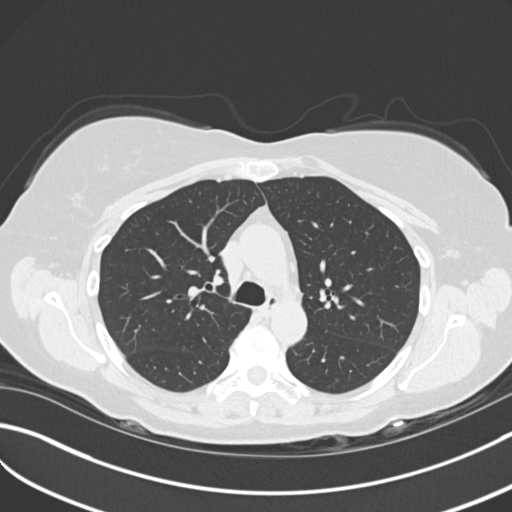
[im 107/153  mediastinal]
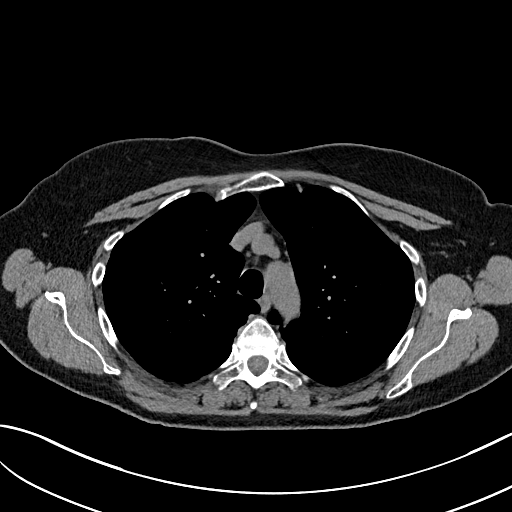
[im 107/153  lung]
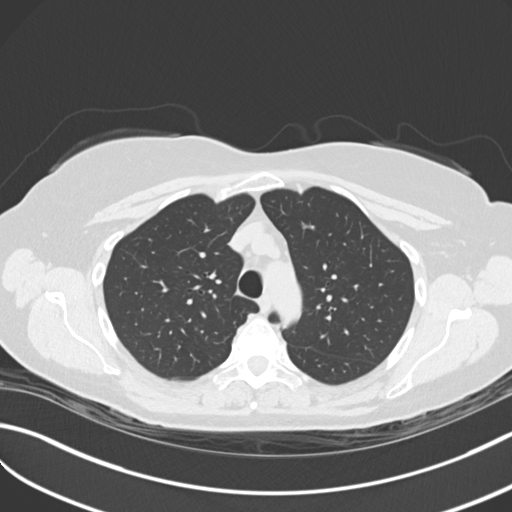
[im 119/153  lung]
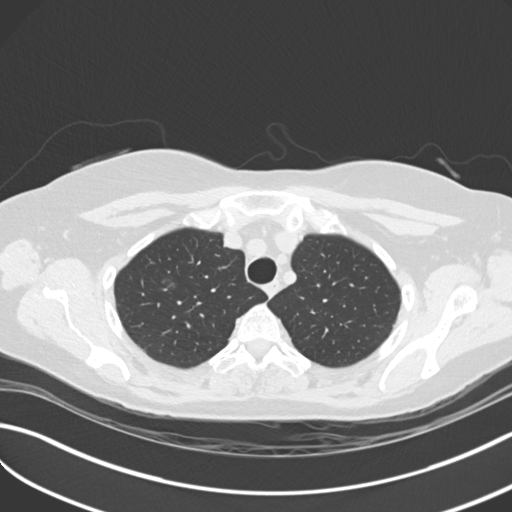
[im 130/153  lung]
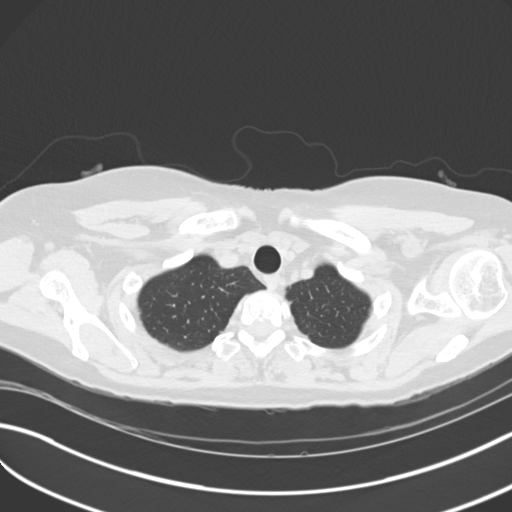
[im 141/153  lung]
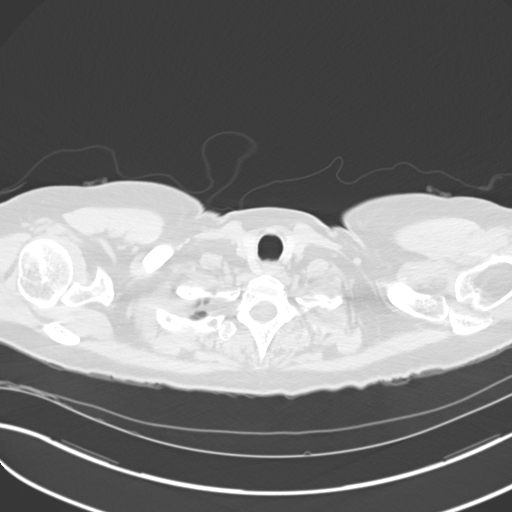

[Series 5: coronal · coronal · 0.60mm/px · 3 of 108 slices shown]
[im 22/108  lung]
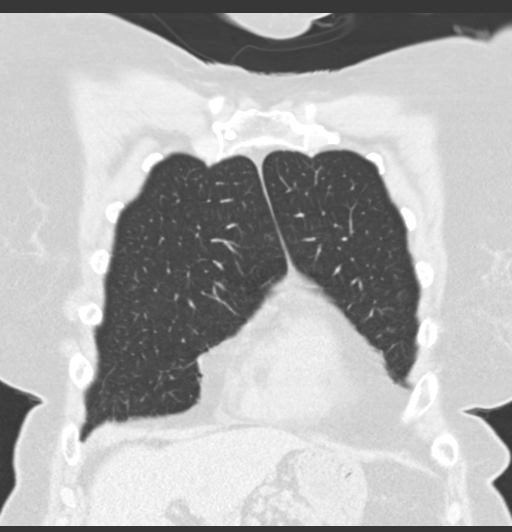
[im 43/108  lung]
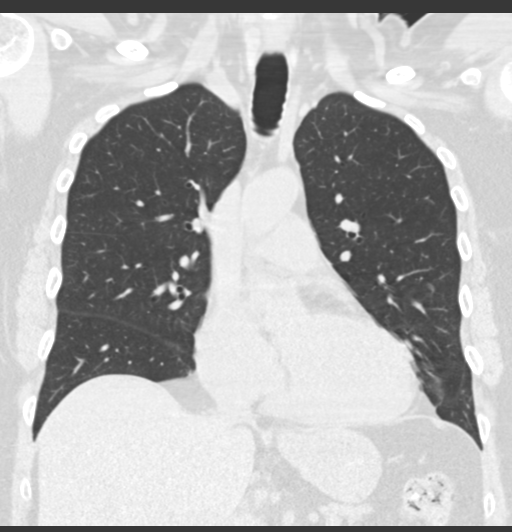
[im 65/108  lung]
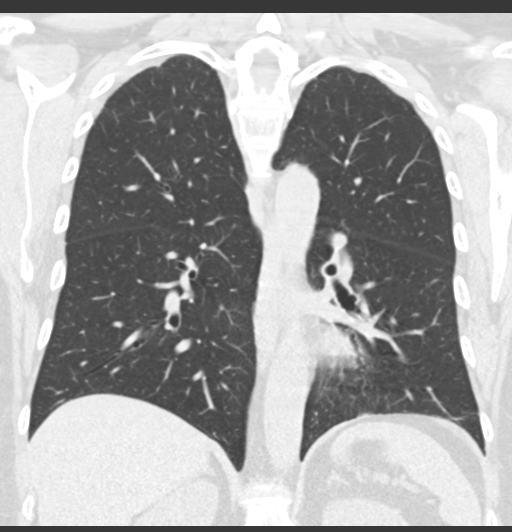

[15 of 36 positions shown; findings below may reference images not displayed]

FINDINGS: Cardiovascular: The heart size is normal. No substantial pericardial
effusion. Atherosclerotic calcification is noted in the wall of the
thoracic aorta.

Mediastinum/Nodes: No mediastinal lymphadenopathy. No evidence for
gross hilar lymphadenopathy although assessment is limited by the
lack of intravenous contrast on today's study. The esophagus has
normal imaging features. There is no axillary lymphadenopathy.

Lungs/Pleura: The central tracheobronchial airways are patent. Sub
solid nodule in the right upper lobe (35/3) is stable measuring 10 x
9 mm today compared to 11 x 9 mm previously. 4 mm left lower lobe
perifissural nodule (82/3) is also unchanged. No new suspicious
pulmonary nodule or mass. No pulmonary edema or pleural effusion.

Upper Abdomen: Unremarkable.

Musculoskeletal: No worrisome lytic or sclerotic osseous
abnormality.
IMPRESSION: 1. Stable sub solid 11 mm right upper lobe pulmonary nodule since
prior study and comparing back to 03/30/2013. This establishes 5
years of stable imaging follow-up and per consensus guidelines no
further imaging follow-up recommended. This recommendation follows
the consensus statement: Guidelines for Management of Incidental
Pulmonary Nodules Detected on CT Images: From the [HOSPITAL]

## 2020-03-28 ENCOUNTER — Encounter: Payer: Self-pay | Admitting: Sports Medicine

## 2020-03-28 ENCOUNTER — Ambulatory Visit (INDEPENDENT_AMBULATORY_CARE_PROVIDER_SITE_OTHER): Payer: PPO

## 2020-03-28 ENCOUNTER — Ambulatory Visit: Payer: PPO | Admitting: Sports Medicine

## 2020-03-28 ENCOUNTER — Other Ambulatory Visit: Payer: Self-pay

## 2020-03-28 DIAGNOSIS — M722 Plantar fascial fibromatosis: Secondary | ICD-10-CM

## 2020-03-28 DIAGNOSIS — M216X1 Other acquired deformities of right foot: Secondary | ICD-10-CM | POA: Diagnosis not present

## 2020-03-28 DIAGNOSIS — M79672 Pain in left foot: Secondary | ICD-10-CM

## 2020-03-28 DIAGNOSIS — G8929 Other chronic pain: Secondary | ICD-10-CM

## 2020-03-28 DIAGNOSIS — M79671 Pain in right foot: Secondary | ICD-10-CM | POA: Diagnosis not present

## 2020-03-28 DIAGNOSIS — M216X2 Other acquired deformities of left foot: Secondary | ICD-10-CM | POA: Diagnosis not present

## 2020-03-28 MED ORDER — TRIAMCINOLONE ACETONIDE 10 MG/ML IJ SUSP
10.0000 mg | Freq: Once | INTRAMUSCULAR | Status: AC
  Administered 2020-03-28: 10 mg

## 2020-03-28 NOTE — Progress Notes (Signed)
Subjective: Jody Wise is a 69 y.o. female patient presents to office with complaint of moderate heel pain on the right>left. Patient admits to post static dyskinesia for 6-8 months.  Patient reports that she has a past history of plantar fasciitis was placed on a year and a half course of prednisone for GI issues and reports that after her doctor stopped her on the steroid started to have pain in her heels especially worse on the right.  Patient reports that pain is pretty significant hurt all day but worse with first few steps out of bed in the morning.  Patient reports that some stretching helps but pain has slowly gotten worse since she has been off the prednisone.  Patient denies any recent trauma or injury.  No other pedal complaints noted.  Review of systems noncontributory.  Patient Active Problem List   Diagnosis Date Noted  . Idiopathic ulcerative colitis (Index) 09/27/2019  . Nasal deformity, acquired 04/15/2019  . Mixed conductive and sensorineural hearing loss of left ear with restricted hearing of right ear 04/13/2019  . Sensorineural hearing loss (SNHL) of right ear with restricted hearing of left ear 04/13/2019  . Neuropathy 01/25/2019  . Primary insomnia 01/25/2019  . Mild anxiety 07/14/2018  . Prediabetes 07/14/2018  . Primary osteoarthritis involving multiple joints 07/14/2018  . Malaise and fatigue 09/26/2015  . Encounter for long-term (current) use of high-risk medication 09/25/2015  . Gastroesophageal reflux disease without esophagitis 09/25/2015  . Irritable bowel syndrome with both constipation and diarrhea 09/25/2015  . Mixed hyperlipidemia 09/25/2015  . Thyroid nodule 09/25/2015  . Vitamin D insufficiency 09/25/2015  . Solitary pulmonary nodule 01/18/2013    Current Outpatient Medications on File Prior to Visit  Medication Sig Dispense Refill  . Ascorbic Acid (VITAMIN C PO) Take by mouth.    Marland Kitchen VITAMIN D PO Take by mouth.    Marland Kitchen 5-Hydroxytryptophan 100 MG CAPS  Take by mouth.    Marland Kitchen ascorbic acid (VITAMIN C) 500 MG tablet Take by mouth.    . Astaxanthin 4 MG CAPS Take by mouth.    . Black Pepper-Turmeric (TURMERIC COMPLEX/BLACK PEPPER) 3-500 MG CAPS Take 2 capsules by mouth daily.    . ciclopirox (PENLAC) 8 % solution SMARTSIG:1 Milliliter(s) Topical Every Night    . Coenzyme Q10 200 MG capsule Take by mouth.    . dicyclomine (BENTYL) 10 MG capsule Take 10 mg by mouth 2 (two) times daily.    . Lutein 6 MG TABS Take by mouth.    . melatonin 1 MG TABS tablet Take by mouth.    . mesalamine (LIALDA) 1.2 g EC tablet Take 4.8 g by mouth every morning.    . Omega-3 Fatty Acids (FISH OIL) 1000 MG CAPS Take by mouth.    . predniSONE (DELTASONE) 5 MG tablet Take by mouth.     No current facility-administered medications on file prior to visit.    Allergies  Allergen Reactions  . Darvon [Propoxyphene Hcl]     hallucinations  . Sulfa Antibiotics     HA    Objective: Physical Exam General: The patient is alert and oriented x3 in no acute distress.  Dermatology: Skin is warm, dry and supple bilateral lower extremities. Nails 1-10 are within normal limits. There is no erythema, edema, no eccymosis, no open lesions present. Integument is otherwise unremarkable.  Vascular: Dorsalis Pedis pulse and Posterior Tibial pulse are 1/4 bilateral. Capillary fill time is immediate to all digits.  Minimal varicosities noted bilateral.  Neurological:  Grossly intact to light touch bilateral.  Musculoskeletal: Tenderness to palpation at the medial calcaneal tubercale and through the insertion of the plantar fascia on the right greater than left foot. No pain with compression of calcaneus bilateral.  No pain with calf compression bilateral. There is decreased Ankle joint range of motion bilateral. All other joints range of motion within normal limits bilateral. Strength 5/5 in all groups bilateral.   Gait: Unassisted, Antalgic avoid weight on heels  Xray, Right/Left  foot:  Normal osseous mineralization. Joint spaces preserved.  Evidence of previous bunionectomy on left.  No fracture/dislocation/boney destruction. Calcaneal spur present with mild thickening of plantar fascia. No other soft tissue abnormalities or radiopaque foreign bodies.   Assessment and Plan: Problem List Items Addressed This Visit    None    Visit Diagnoses    Heel pain, chronic, left    -  Primary   Relevant Medications   predniSONE (DELTASONE) 5 MG tablet   triamcinolone acetonide (KENALOG) 10 MG/ML injection 10 mg (Completed) (Start on 03/28/2020  7:45 PM)   Other Relevant Orders   DG Foot Complete Left   Pain of right heel       Relevant Medications   triamcinolone acetonide (KENALOG) 10 MG/ML injection 10 mg (Completed) (Start on 03/28/2020  7:45 PM)   Other Relevant Orders   DG Foot Complete Right   Plantar fasciitis, bilateral       Acquired equinus deformity of both feet          -Complete examination performed.  -Xrays reviewed -Discussed with patient in detail the condition of plantar fasciitis, how this occurs and general treatment options. Explained both conservative and surgical treatments.  -After oral consent and aseptic prep, injected a mixture containing 1 ml of 2%  plain lidocaine, 1 ml 0.5% plain marcaine, 0.5 ml of kenalog 10 and 0.5 ml of dexamethasone phosphate into Right heel. Post-injection care discussed with patient.  -Recommended good supportive shoes and advised use of her previous own orthotics with heel lifts as provided at this visit -Offered patient a night splint however at this time she decides to hold off on getting this wants [place] to verify her insurance coverage before getting -Explained and dispensed to patient daily stretching exercises. -Recommend patient to ice affected area 1-2x daily. -Patient to return to office in 3-4 weeks for follow up or sooner if problems or questions arise.  Landis Martins, DPM

## 2020-04-04 DIAGNOSIS — M722 Plantar fascial fibromatosis: Secondary | ICD-10-CM

## 2020-04-04 DIAGNOSIS — M216X2 Other acquired deformities of left foot: Secondary | ICD-10-CM

## 2020-04-12 DIAGNOSIS — Z9889 Other specified postprocedural states: Secondary | ICD-10-CM | POA: Diagnosis not present

## 2020-04-12 DIAGNOSIS — Q181 Preauricular sinus and cyst: Secondary | ICD-10-CM | POA: Diagnosis not present

## 2020-04-12 DIAGNOSIS — R519 Headache, unspecified: Secondary | ICD-10-CM | POA: Diagnosis not present

## 2020-04-12 DIAGNOSIS — H90A32 Mixed conductive and sensorineural hearing loss, unilateral, left ear with restricted hearing on the contralateral side: Secondary | ICD-10-CM | POA: Diagnosis not present

## 2020-04-12 DIAGNOSIS — L720 Epidermal cyst: Secondary | ICD-10-CM | POA: Diagnosis not present

## 2020-04-12 DIAGNOSIS — Z974 Presence of external hearing-aid: Secondary | ICD-10-CM | POA: Diagnosis not present

## 2020-04-12 DIAGNOSIS — G8929 Other chronic pain: Secondary | ICD-10-CM | POA: Diagnosis not present

## 2020-04-12 DIAGNOSIS — H90A21 Sensorineural hearing loss, unilateral, right ear, with restricted hearing on the contralateral side: Secondary | ICD-10-CM | POA: Diagnosis not present

## 2020-04-27 ENCOUNTER — Encounter: Payer: Self-pay | Admitting: Sports Medicine

## 2020-04-27 ENCOUNTER — Other Ambulatory Visit: Payer: Self-pay

## 2020-04-27 ENCOUNTER — Ambulatory Visit: Payer: PPO | Admitting: Sports Medicine

## 2020-04-27 DIAGNOSIS — M216X2 Other acquired deformities of left foot: Secondary | ICD-10-CM

## 2020-04-27 DIAGNOSIS — G8929 Other chronic pain: Secondary | ICD-10-CM

## 2020-04-27 DIAGNOSIS — M79671 Pain in right foot: Secondary | ICD-10-CM

## 2020-04-27 DIAGNOSIS — M779 Enthesopathy, unspecified: Secondary | ICD-10-CM

## 2020-04-27 DIAGNOSIS — M79672 Pain in left foot: Secondary | ICD-10-CM

## 2020-04-27 DIAGNOSIS — M216X1 Other acquired deformities of right foot: Secondary | ICD-10-CM

## 2020-04-27 DIAGNOSIS — M722 Plantar fascial fibromatosis: Secondary | ICD-10-CM

## 2020-04-27 MED ORDER — DICLOFENAC SODIUM 1 % EX GEL: 4.0000 g | Freq: Four times a day (QID) | CUTANEOUS | 1 refills | Status: AC

## 2020-04-27 MED ORDER — DICLOFENAC SODIUM 1 % EX GEL: 4.0000 g | Freq: Four times a day (QID) | CUTANEOUS | 1 refills | Status: DC

## 2020-04-27 NOTE — Patient Instructions (Signed)
Orthotic code: S1115 Diagnosis code: M72.2 plantar fasciitis  Voltaren gel to toes at bedtime

## 2020-04-27 NOTE — Progress Notes (Signed)
Subjective: Jody Wise is a 69 y.o. female returns to office for follow up evaluation after Left/Right heel injection for plantar fasciitis, injection #1 administered 4 weeks ago. Patient states that the injection seems to help her pain.  Pain is 90% better anddecreased in frequency to the area reports now that she has some pain in her toes all toes equally bilateral worse at night with sharp pains and sometimes wakes her up at bedtime. Patient denies any recent changes in medications since last encounter.  No other issues noted.  Patient Active Problem List   Diagnosis Date Noted  . Cyst of ear canal 04/12/2020  . History of tympanoplasty of left ear 04/12/2020  . Idiopathic ulcerative colitis ([place]) 09/27/2019  . Nasal deformity, acquired 04/15/2019  . Mixed conductive and sensorineural hearing loss of left ear with restricted hearing of right ear 04/13/2019  . Sensorineural hearing loss (SNHL) of right ear with restricted hearing of left ear 04/13/2019  . Neuropathy 01/25/2019  . Primary insomnia 01/25/2019  . Mild anxiety 07/14/2018  . Prediabetes 07/14/2018  . Primary osteoarthritis involving multiple joints 07/14/2018  . Malaise and fatigue 09/26/2015  . Encounter for long-term (current) use of high-risk medication 09/25/2015  . Gastroesophageal reflux disease without esophagitis 09/25/2015  . Irritable bowel syndrome with both constipation and diarrhea 09/25/2015  . Mixed hyperlipidemia 09/25/2015  . Thyroid nodule 09/25/2015  . Vitamin D insufficiency 09/25/2015  . Solitary pulmonary nodule 01/18/2013    Current Outpatient Medications on File Prior to Visit  Medication Sig Dispense Refill  . 5-Hydroxytryptophan 100 MG CAPS Take by mouth.    . Ascorbic Acid (VITAMIN C PO) Take by mouth.    Marland Kitchen ascorbic acid (VITAMIN C) 500 MG tablet Take by mouth.    . Astaxanthin 4 MG CAPS Take by mouth.    . Black Pepper-Turmeric (TURMERIC COMPLEX/BLACK PEPPER) 3-500 MG CAPS Take 2 capsules by  mouth daily.    . ciclopirox (PENLAC) 8 % solution SMARTSIG:1 Milliliter(s) Topical Every Night    . Coenzyme Q10 200 MG capsule Take by mouth.    . dicyclomine (BENTYL) 10 MG capsule Take 10 mg by mouth 2 (two) times daily.    . Lutein 6 MG TABS Take by mouth.    . melatonin 1 MG TABS tablet Take by mouth.    . mesalamine (LIALDA) 1.2 g EC tablet Take 4.8 g by mouth every morning.    . Omega-3 Fatty Acids (FISH OIL) 1000 MG CAPS Take by mouth.    . predniSONE (DELTASONE) 5 MG tablet Take by mouth.    Marland Kitchen VITAMIN D PO Take by mouth.     No current facility-administered medications on file prior to visit.    Allergies  Allergen Reactions  . Darvon [Propoxyphene Hcl]     hallucinations  . Sulfa Antibiotics     HA    Objective:   General:  Alert and oriented x 3, in no acute distress  Dermatology: Skin is warm, dry and supple bilateral lower extremities. Nails 1-10 are within normal limits. There is no erythema, edema, no eccymosis, no open lesions present. Integument is otherwise unremarkable.  Vascular: Dorsalis Pedis pulse and Posterior Tibial pulse are 1/4 bilateral. Capillary fill time is immediate to all digits.  Minimal varicosities noted bilateral.  Neurological: Grossly intact to light touch bilateral.  Musculoskeletal: No reproducible tenderness to palpation at the medial calcaneal tubercale and through the insertion of the plantar fascia on the right or left but occasionally has a  little bit of soreness still on the right heel but much better than before.  Patient also admits to sharp pain that wakes her up at night that runs across the bases of all of her toes.  No pain with compression of calcaneus bilateral.  No pain with calf compression bilateral. There is decreased Ankle joint range of motion bilateral. All other joints range of motion within normal limits bilateral. Strength 5/5 in all groups bilateral.    Assessment and Plan: Problem List Items Addressed This  Visit    None    Visit Diagnoses    Plantar fasciitis, bilateral    -  Primary   Pain of right heel       Heel pain, chronic, left       Acquired equinus deformity of both feet       Capsulitis          -Complete examination performed.  -Previous x-rays reviewed. -Re-Discussed with patient in detail the condition of plantar fasciitis, how this  occurs related to the foot type of the patient and general treatment options. -No injection given at this time since patient is 90% better -Applied metatarsal padding to current orthotics to see if this will help with any toe pain or issues -Prescribed Voltaren topical foot to use at bedtime -Advised patient to continue with night splint and may alternate to her left foot for at least 1 more month after 1 month may slowly wean as instructed -Continue with stretching, icing, good supportive shoes, custom inserts daily that she already has that is 69 years old but is still in good condition also advised patient if she is interested in getting a new pair of orthotics she may call insurance herself to verify the benefits and if she wants to proceed with getting them may come back when she is ready for casting appointment.  -Discussed long term care and reocurrence; will closely monitor; if fails to improve will consider other treatment modalities.  -Patient to return to office as needed or sooner if problems or questions arise.  Landis Martins, DPM

## 2020-05-07 ENCOUNTER — Telehealth: Payer: Self-pay | Admitting: Sports Medicine

## 2020-05-07 DIAGNOSIS — K5130 Ulcerative (chronic) rectosigmoiditis without complications: Secondary | ICD-10-CM | POA: Diagnosis not present

## 2020-05-07 NOTE — Telephone Encounter (Signed)
Pt left message that she called her insurance company with the codes for orthotics and they are covered and she is needing to schedule an appt.  I returned call and left message for pt to call to schedule an appt but we only currently have appts in [place] office, if she wanted an appt in [place] it would be in January.

## 2020-05-07 NOTE — Telephone Encounter (Signed)
Pt called back and left message. I have returned call and I scheduled her for 12.17 in [place] and have faxed the auth to [facility].Marland Kitchen

## 2020-05-08 DIAGNOSIS — K5130 Ulcerative (chronic) rectosigmoiditis without complications: Secondary | ICD-10-CM | POA: Diagnosis not present

## 2020-05-11 ENCOUNTER — Other Ambulatory Visit: Payer: PPO | Admitting: Orthotics

## 2020-05-11 ENCOUNTER — Other Ambulatory Visit: Payer: Self-pay

## 2020-05-11 DIAGNOSIS — M722 Plantar fascial fibromatosis: Secondary | ICD-10-CM | POA: Diagnosis not present

## 2020-06-06 ENCOUNTER — Encounter: Payer: PPO | Admitting: Orthotics

## 2020-06-26 ENCOUNTER — Other Ambulatory Visit: Payer: Self-pay

## 2020-06-26 ENCOUNTER — Ambulatory Visit: Payer: PPO | Admitting: Orthotics

## 2020-06-26 DIAGNOSIS — M722 Plantar fascial fibromatosis: Secondary | ICD-10-CM

## 2020-06-26 NOTE — Progress Notes (Signed)
Patient picked up f/o and was pleased with fit, comfort, and function.  Worked well with footwear.  Told of rbeak in period and how to report any issues.  

## 2020-10-17 DIAGNOSIS — Z6829 Body mass index (BMI) 29.0-29.9, adult: Secondary | ICD-10-CM | POA: Diagnosis not present

## 2020-10-17 DIAGNOSIS — Z1272 Encounter for screening for malignant neoplasm of vagina: Secondary | ICD-10-CM | POA: Diagnosis not present

## 2020-10-17 DIAGNOSIS — Z124 Encounter for screening for malignant neoplasm of cervix: Secondary | ICD-10-CM | POA: Diagnosis not present

## 2020-10-17 DIAGNOSIS — Z90710 Acquired absence of both cervix and uterus: Secondary | ICD-10-CM | POA: Diagnosis not present

## 2020-10-17 DIAGNOSIS — M8588 Other specified disorders of bone density and structure, other site: Secondary | ICD-10-CM | POA: Diagnosis not present

## 2020-10-17 DIAGNOSIS — N958 Other specified menopausal and perimenopausal disorders: Secondary | ICD-10-CM | POA: Diagnosis not present

## 2021-02-13 DIAGNOSIS — H05012 Cellulitis of left orbit: Secondary | ICD-10-CM | POA: Diagnosis not present

## 2021-02-13 DIAGNOSIS — K5120 Ulcerative (chronic) proctitis without complications: Secondary | ICD-10-CM | POA: Diagnosis not present

## 2021-02-14 DIAGNOSIS — H00014 Hordeolum externum left upper eyelid: Secondary | ICD-10-CM | POA: Diagnosis not present

## 2021-02-14 DIAGNOSIS — H04123 Dry eye syndrome of bilateral lacrimal glands: Secondary | ICD-10-CM | POA: Diagnosis not present

## 2021-02-14 DIAGNOSIS — H2513 Age-related nuclear cataract, bilateral: Secondary | ICD-10-CM | POA: Diagnosis not present

## 2021-04-30 DIAGNOSIS — D2239 Melanocytic nevi of other parts of face: Secondary | ICD-10-CM | POA: Diagnosis not present

## 2021-04-30 DIAGNOSIS — D225 Melanocytic nevi of trunk: Secondary | ICD-10-CM | POA: Diagnosis not present

## 2021-04-30 DIAGNOSIS — C44311 Basal cell carcinoma of skin of nose: Secondary | ICD-10-CM | POA: Diagnosis not present

## 2024-06-02 ENCOUNTER — Other Ambulatory Visit (HOSPITAL_BASED_OUTPATIENT_CLINIC_OR_DEPARTMENT_OTHER): Payer: Self-pay

## 2024-06-02 MED ORDER — MESALAMINE 1.2 G PO TBEC
4.8000 g | DELAYED_RELEASE_TABLET | Freq: Every day | ORAL | 3 refills | Status: AC
  Filled 2024-06-02: qty 360, 90d supply, fill #0

## 2024-06-06 ENCOUNTER — Other Ambulatory Visit (HOSPITAL_BASED_OUTPATIENT_CLINIC_OR_DEPARTMENT_OTHER): Payer: Self-pay
